# Patient Record
Sex: Female | Born: 1995 | Race: White | Hispanic: No | Marital: Single | Smoking: Never smoker
Health system: Southern US, Community
[De-identification: ages and names within clinical notes are randomized; demographics above are authoritative.]

---

## 2017-06-15 ENCOUNTER — Encounter: Payer: Self-pay | Admitting: Emergency Medicine

## 2017-06-15 ENCOUNTER — Emergency Department
Admission: EM | Admit: 2017-06-15 | Discharge: 2017-06-15 | Disposition: A | Discharge status: Home / Self Care | Payer: BLUE CROSS/BLUE SHIELD | Attending: Emergency Medicine | Admitting: Emergency Medicine

## 2017-06-15 ENCOUNTER — Other Ambulatory Visit: Payer: Self-pay

## 2017-06-15 ENCOUNTER — Emergency Department: Payer: BLUE CROSS/BLUE SHIELD

## 2017-06-15 DIAGNOSIS — R109 Unspecified abdominal pain: Secondary | ICD-10-CM | POA: Insufficient documentation

## 2017-06-15 DIAGNOSIS — R112 Nausea with vomiting, unspecified: Secondary | ICD-10-CM | POA: Insufficient documentation

## 2017-06-15 DIAGNOSIS — K529 Noninfective gastroenteritis and colitis, unspecified: Secondary | ICD-10-CM

## 2017-06-15 DIAGNOSIS — R1084 Generalized abdominal pain: Secondary | ICD-10-CM

## 2017-06-15 DIAGNOSIS — K5289 Other specified noninfective gastroenteritis and colitis: Secondary | ICD-10-CM | POA: Diagnosis not present

## 2017-06-15 LAB — URINALYSIS, ROUTINE W REFLEX MICROSCOPIC
BILIRUBIN URINE: NEGATIVE
Bacteria, UA: NONE SEEN
Glucose, UA: NEGATIVE mg/dL
KETONES UR: 20 mg/dL — AB
Nitrite: NEGATIVE
Protein, ur: NEGATIVE mg/dL
Specific Gravity, Urine: 1.02 (ref 1.005–1.030)
pH: 5 (ref 5.0–8.0)

## 2017-06-15 LAB — CBC WITH DIFFERENTIAL/PLATELET
BASOS ABS: 0 10*3/uL (ref 0–0.1)
BASOS PCT: 0 %
Eosinophils Absolute: 0 10*3/uL (ref 0–0.7)
Eosinophils Relative: 0 %
HEMATOCRIT: 48.2 % — AB (ref 35.0–47.0)
Hemoglobin: 16.8 g/dL — ABNORMAL HIGH (ref 12.0–16.0)
LYMPHS PCT: 3 %
Lymphs Abs: 0.5 10*3/uL — ABNORMAL LOW (ref 1.0–3.6)
MCH: 32.1 pg (ref 26.0–34.0)
MCHC: 34.9 g/dL (ref 32.0–36.0)
MCV: 92.1 fL (ref 80.0–100.0)
MONO ABS: 0.7 10*3/uL (ref 0.2–0.9)
Monocytes Relative: 4 %
NEUTROS ABS: 14.4 10*3/uL — AB (ref 1.4–6.5)
Neutrophils Relative %: 93 %
PLATELETS: 259 10*3/uL (ref 150–440)
RBC: 5.23 MIL/uL — ABNORMAL HIGH (ref 3.80–5.20)
RDW: 12.4 % (ref 11.5–14.5)
WBC: 15.6 10*3/uL — ABNORMAL HIGH (ref 3.6–11.0)

## 2017-06-15 LAB — COMPREHENSIVE METABOLIC PANEL
ALBUMIN: 4.9 g/dL (ref 3.5–5.0)
ALT: 23 U/L (ref 14–54)
AST: 31 U/L (ref 15–41)
Alkaline Phosphatase: 50 U/L (ref 38–126)
Anion gap: 12 (ref 5–15)
BILIRUBIN TOTAL: 1.5 mg/dL — AB (ref 0.3–1.2)
BUN: 13 mg/dL (ref 6–20)
CHLORIDE: 103 mmol/L (ref 101–111)
CO2: 23 mmol/L (ref 22–32)
CREATININE: 0.6 mg/dL (ref 0.44–1.00)
Calcium: 9.3 mg/dL (ref 8.9–10.3)
GFR calc Af Amer: >60 mL/min (ref >60)
GLUCOSE: 122 mg/dL — AB (ref 65–99)
POTASSIUM: 3.7 mmol/L (ref 3.5–5.1)
Sodium: 138 mmol/L (ref 135–145)
Total Protein: 8 g/dL (ref 6.5–8.1)

## 2017-06-15 LAB — POC URINE PREG, ED: Preg Test, Ur: NEGATIVE

## 2017-06-15 LAB — LIPASE, BLOOD: Lipase: 34 U/L (ref 11–51)

## 2017-06-15 MED ORDER — SODIUM CHLORIDE 0.9 % IV BOLUS
1000.0000 mL | Freq: Once | INTRAVENOUS | Status: AC
  Administered 2017-06-15: 1000 mL via INTRAVENOUS

## 2017-06-15 MED ORDER — IOHEXOL 300 MG/ML  SOLN: 60.0000 mL | Freq: Once | INTRAMUSCULAR | Status: DC | PRN

## 2017-06-15 MED ORDER — ONDANSETRON HCL 4 MG/2ML IJ SOLN
4.0000 mg | Freq: Once | INTRAMUSCULAR | Status: AC
  Administered 2017-06-15: 4 mg via INTRAVENOUS
  Filled 2017-06-15: qty 2

## 2017-06-15 MED ORDER — IOPAMIDOL (ISOVUE-300) INJECTION 61%
15.0000 mL | INTRAVENOUS | Status: AC
  Administered 2017-06-15: 15 mL via ORAL

## 2017-06-15 MED ORDER — IOPAMIDOL (ISOVUE-300) INJECTION 61%
60.0000 mL | Freq: Once | INTRAVENOUS | Status: AC | PRN
  Administered 2017-06-15: 60 mL via INTRAVENOUS

## 2017-06-15 MED ORDER — MORPHINE SULFATE (PF) 4 MG/ML IV SOLN
4.0000 mg | Freq: Once | INTRAVENOUS | Status: AC
  Administered 2017-06-15: 4 mg via INTRAVENOUS
  Filled 2017-06-15: qty 1

## 2017-06-15 MED ORDER — LOPERAMIDE HCL 2 MG PO TABS: 2.0000 mg | ORAL_TABLET | Freq: Four times a day (QID) | ORAL | 0 refills | Status: AC | PRN

## 2017-06-15 MED ORDER — ONDANSETRON 4 MG PO TBDP: 4.0000 mg | ORAL_TABLET | Freq: Three times a day (TID) | ORAL | 0 refills | Status: AC | PRN

## 2017-06-15 NOTE — Discharge Instructions (Addendum)
Please follow-up with your doctor in the next 2 to 3 days for recheck if he continued to have symptoms.  Please take Zofran and loperamide as needed for nausea and diarrhea, as written.  Please drink plenty of fluids and obtain plenty of rest.  Return to the emergency department for any significant fever 101 or higher development of focal abdominal pain or worsening abdominal pain.

## 2017-06-15 NOTE — ED Triage Notes (Signed)
Pt states abdominal pain began around 0730 this am with vomiting every 30 min or so.  Appears pale. Crying out in pain in triage, states pain is in her mid abdomen.

## 2017-06-15 NOTE — ED Provider Notes (Signed)
[facility] Emergency Department Provider Note  Time seen: 1:15 PM  I have reviewed the triage vital signs and the nursing notes.   HISTORY  Chief Complaint Abdominal Pain and Emesis    HPI Rhonda Mays is a 22 y.o. female with no past medical history who presents to the emergency department for nausea vomiting and abdominal pain.  According to the patient since this morning she has been nauseated with frequent episodes of vomiting and abdominal pain around her umbilicus and occasionally in the right lower quadrant.  Denies any fever, dysuria or hematuria, vaginal bleeding or discharge.  Last menstrual period was 2 weeks ago.  Denies any diarrhea.  Describes her pain is moderate, says it is hurting fairly diffusely but worse around her bellybutton.  Dull aching/cramping type pain.   History reviewed. No pertinent past medical history.  There are no active problems to display for this patient.   History reviewed. No pertinent surgical history.  Prior to Admission medications   Not on File    No Known Allergies  No family history on file.  Social History Social History   Tobacco Use  . Smoking status: Never Smoker  . Smokeless tobacco: Never Used  Substance Use Topics  . Alcohol use: Not Currently  . Drug use: Not on file    Review of Systems Constitutional: Negative for fever. Eyes: Negative for visual complaints ENT: Negative for recent illness/congestion Cardiovascular: Negative for chest pain. Respiratory: Negative for shortness of breath. Gastrointestinal: Positive for moderate to significant abdominal pain periumbilical and right lower quadrant.  Negative for diarrhea but positive for nausea vomiting. Genitourinary: Negative for dysuria, dark urine or foul smell to the urine.  Negative for vaginal bleeding or discharge. Musculoskeletal: Negative for musculoskeletal complaints Skin: Negative for skin complaints  Neurological: Negative  for headache All other ROS negative  ____________________________________________   PHYSICAL EXAM:  VITAL SIGNS: ED Triage Vitals  Enc Vitals Group     BP 06/15/17 1259 110/72     Pulse Rate 06/15/17 1259 (!) 118     Resp 06/15/17 1259 16     Temp 06/15/17 1259 97.9 F (36.6 C)     Temp src --      SpO2 06/15/17 1259 100 %     Weight 06/15/17 1300 95 lb (43.1 kg)     Height 06/15/17 1300 5' (1.524 m)     Head Circumference --      Peak Flow --      Pain Score 06/15/17 1300 8     Pain Loc --      Pain Edu? --      Excl. in GC? --    Constitutional: Alert and oriented.  Mild distress due to abdominal pain holding abdomen and moaning at times. Eyes: Normal exam ENT   Head: Normocephalic and atraumatic.   Mouth/Throat: Mucous membranes are moist. Cardiovascular: Normal rate, regular rhythm. No murmur Respiratory: Normal respiratory effort without tachypnea nor retractions. Breath sounds are clear  Gastrointestinal: Soft, moderate periumbilical tenderness to palpation, mild diffuse tenderness, no rebound or guarding.  No distention. Musculoskeletal: Nontender with normal range of motion in all extremities.  Neurologic:  Normal speech and language. No gross focal neurologic deficits  Skin:  Skin is warm, dry and intact.  Psychiatric: Mood and affect are normal.    RADIOLOGY  CT most consistent with enteritis.  ____________________________________________   INITIAL IMPRESSION / ASSESSMENT AND PLAN / ED COURSE  Pertinent labs & imaging results that  were available during my care of the patient were reviewed by me and considered in my medical decision making (see chart for details).  Patient presents to the emergency department for periumbilical abdominal pain nausea vomiting since this morning.  Differential would include gastroenteritis, enteritis, gastritis, gastric or peptic ulcers, urinary tract infection, pelvic infection, appendicitis.  We will check labs,  urinalysis, proceed with CT scan of the abdomen/pelvis to further evaluate.  Patient agreeable to this plan of care.  Patient's labs show a moderate leukocytosis of 15,000, otherwise largely within normal limits.  We will dose IV fluids for mild amount of ketones within the urinalysis.  Patient CT scan has resulted consistent with enteritis.  We will place the patient on Zofran, loperamide to be used as needed.  Patient agreeable to this plan of care. ____________________________________________   FINAL CLINICAL IMPRESSION(S) / ED DIAGNOSES  Abdominal pain Enteritis   Minna Antis, MD 06/15/17 1541

## 2019-08-30 IMAGING — CT CT ABD-PELV W/ CM
2 of 4 series · 16 of 46 positions shown, 18 images · IV contrast (isovue)
Comparison: None.

CLINICAL DATA: 21-year-old female with acute abdominal and pelvic
pain and vomiting today.

EXAM:
CT ABDOMEN AND PELVIS WITH CONTRAST
TECHNIQUE: Multidetector CT imaging of the abdomen and pelvis was performed
using the standard protocol following bolus administration of
intravenous contrast.
CONTRAST:  60 cc intravenous Isovue 300

[Series 2: routine abd/pel with · axial · 0.63mm/px · z∈[-490,-90]mm · 13 of 88 slices shown, 15 images]
[im 4/88  soft-tissue]
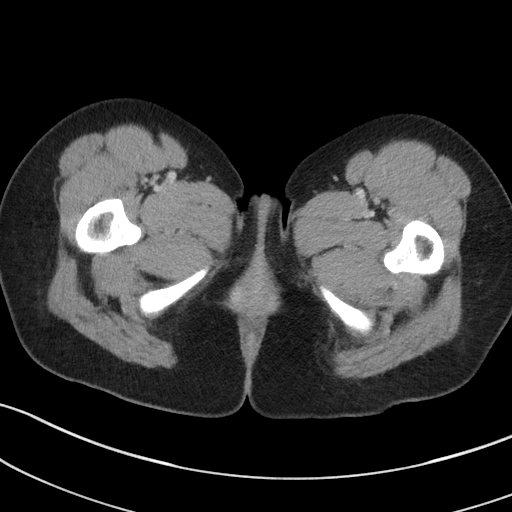
[im 4/88  bone]
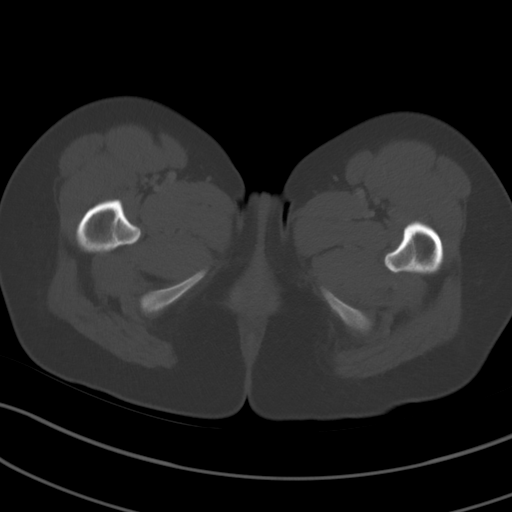
[im 11/88  soft-tissue]
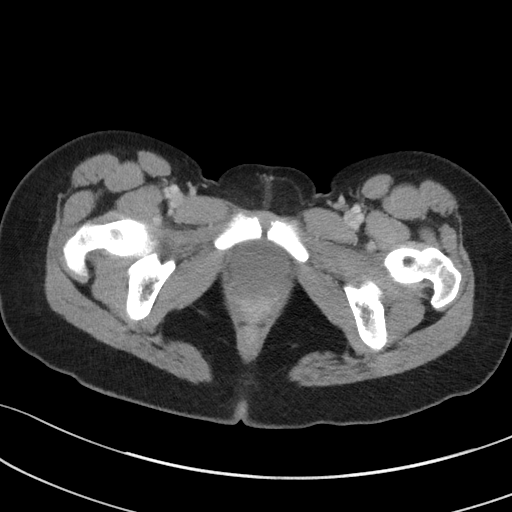
[im 19/88  soft-tissue]
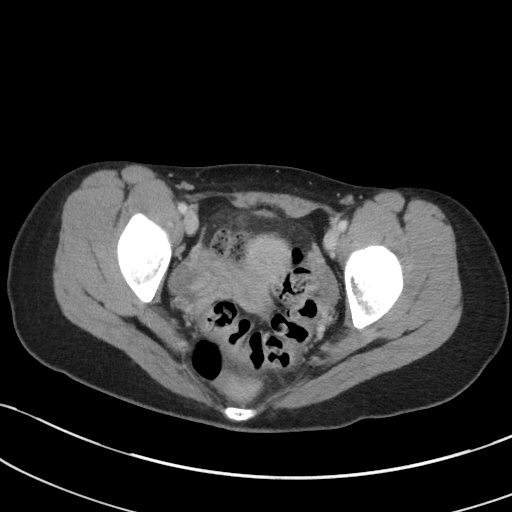
[im 26/88  soft-tissue]
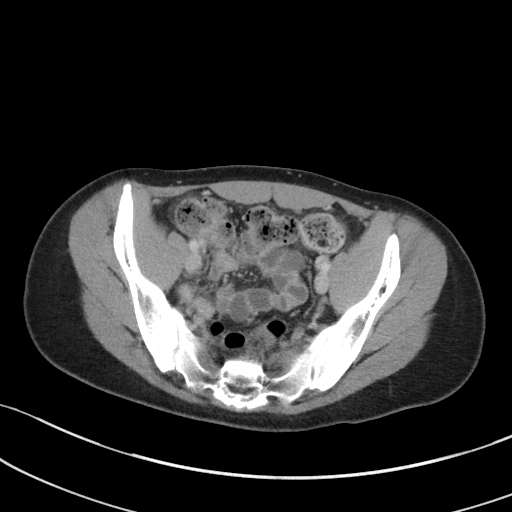
[im 30/88  soft-tissue]
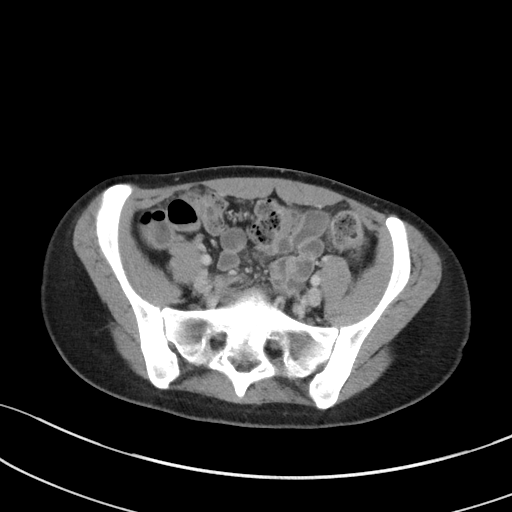
[im 37/88  soft-tissue]
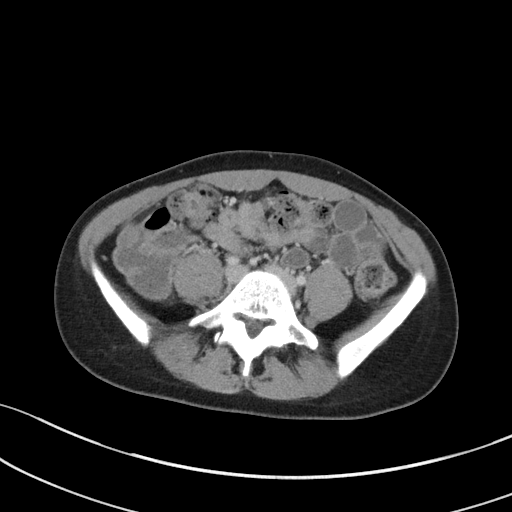
[im 44/88  soft-tissue]
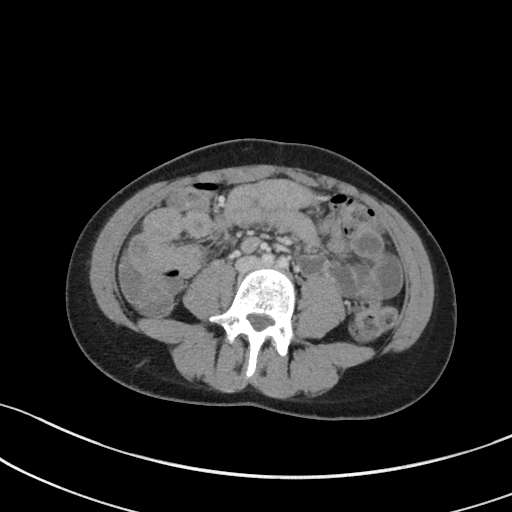
[im 51/88  soft-tissue]
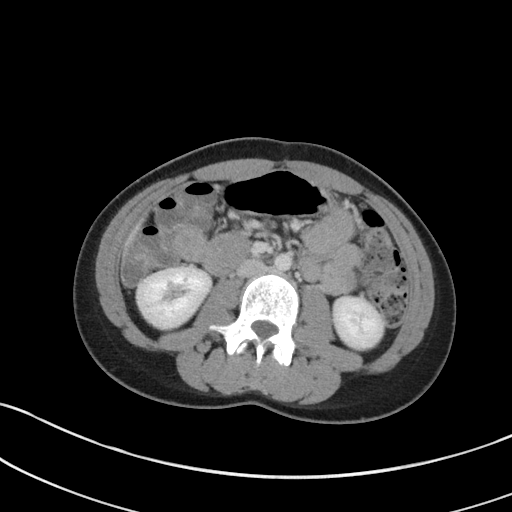
[im 59/88  soft-tissue]
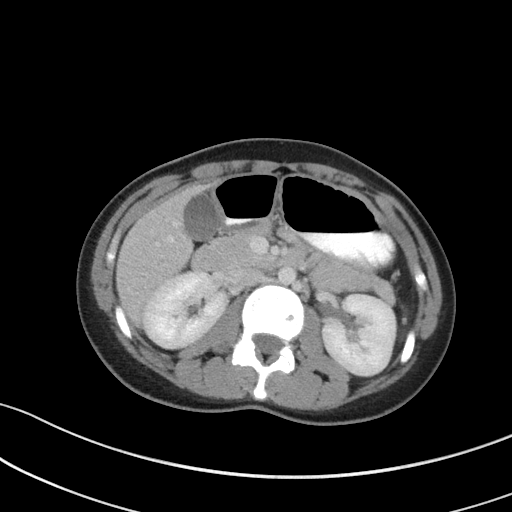
[im 59/88  bone]
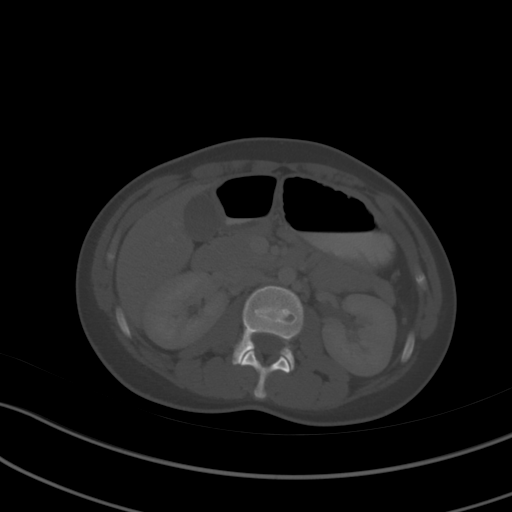
[im 62/88  soft-tissue]
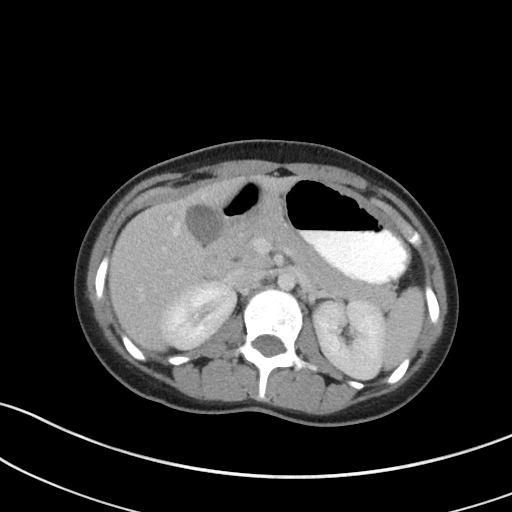
[im 69/88  soft-tissue]
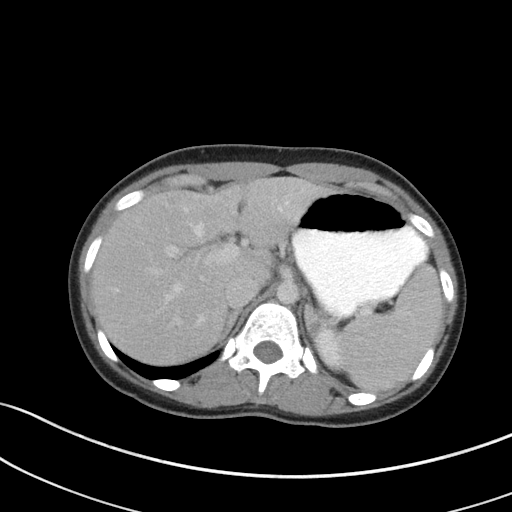
[im 77/88  soft-tissue]
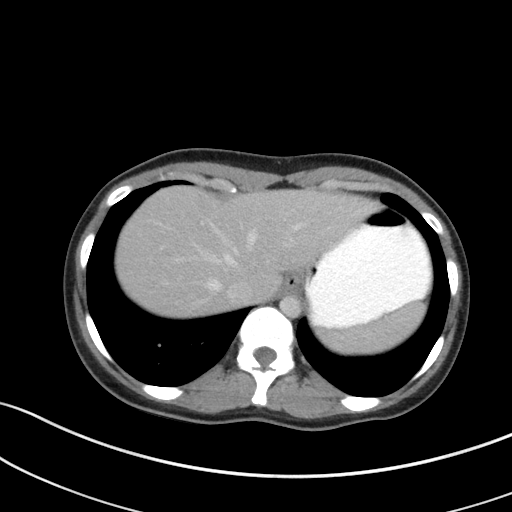
[im 84/88  soft-tissue]
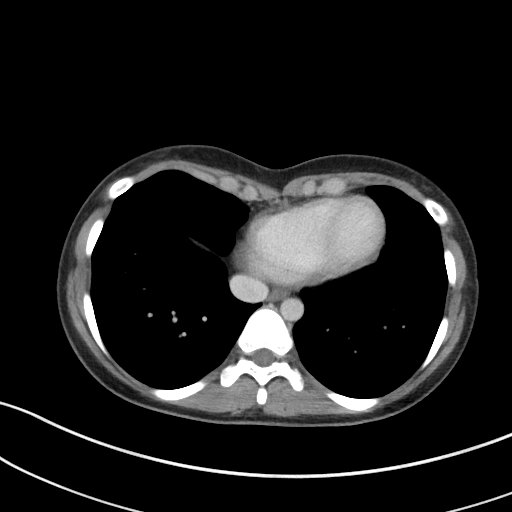

[Series 5: coronal st · coronal · 0.57mm/px · 3 of 62 slices shown]
[im 21/62  soft-tissue]
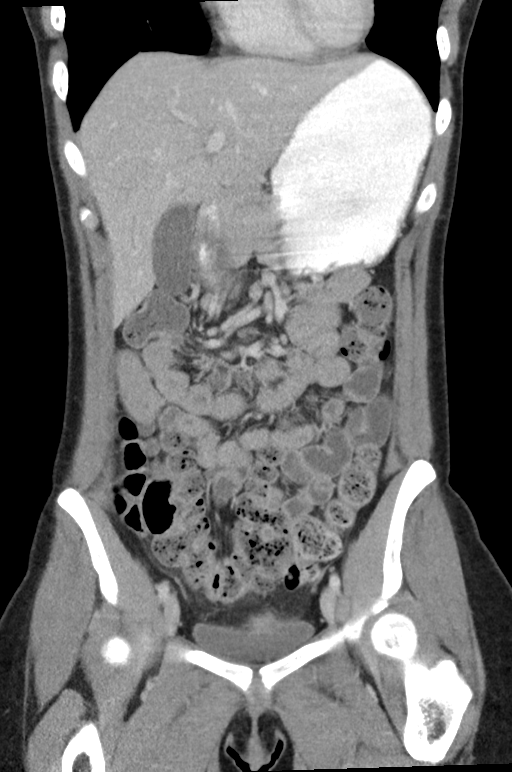
[im 28/62  soft-tissue]
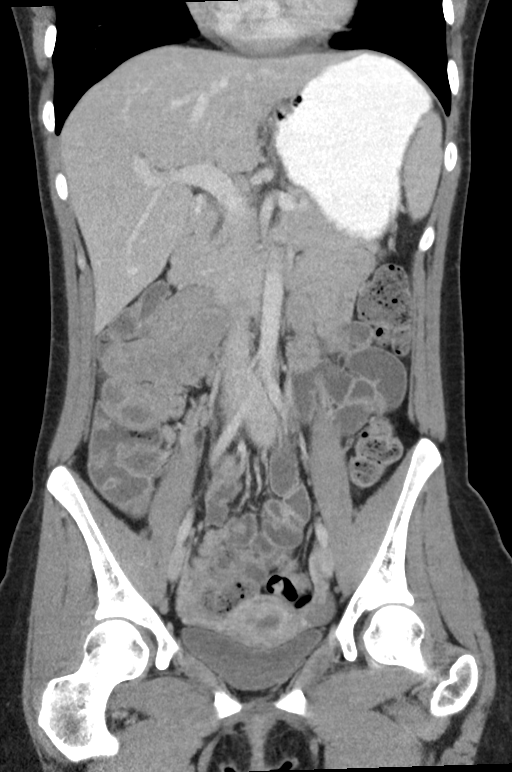
[im 34/62  soft-tissue]
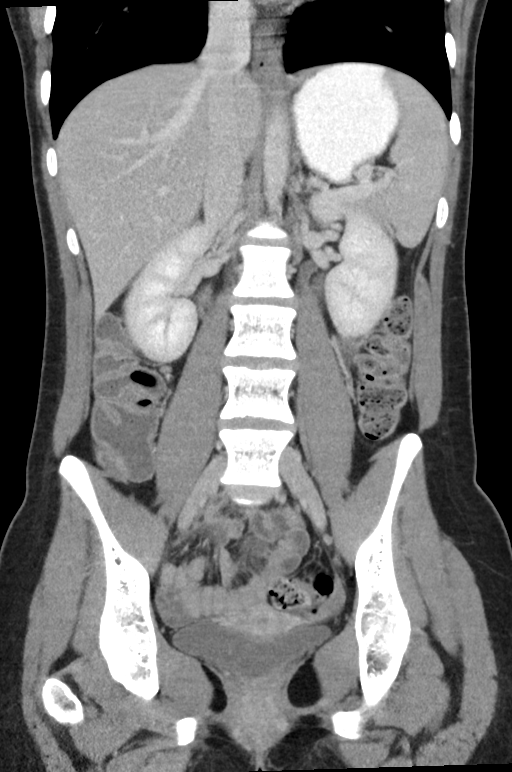

[16 of 46 positions shown; findings below may reference images not displayed]

FINDINGS: Lower chest: No acute abnormality.  Pectus excavatum identified.

Hepatobiliary: The liver and gallbladder are unremarkable. No
biliary dilatation.

Pancreas: Unremarkable

Spleen: Unremarkable

Adrenals/Urinary Tract: The kidneys, adrenal glands and bladder are
unremarkable.

Stomach/Bowel: Apparent mild circumferential wall thickening of
small bowel loops within the abdomen and pelvis noted and suggestive
of an enteritis. There is no evidence of bowel obstruction or other
bowel wall thickening. No inflammatory changes are identified. The
appendix is normal.

Vascular/Lymphatic: No significant vascular findings are present. No
enlarged abdominal or pelvic lymph nodes.

Reproductive: Uterus and bilateral adnexa are unremarkable.

Other: No ascites, pneumoperitoneum or abscess.

Musculoskeletal: No acute or significant osseous findings.
IMPRESSION: 1. Apparent mild circumferential wall thickening of small bowel
loops within the abdomen and pelvis suggestive of an enteritis. No
evidence of bowel obstruction or pneumoperitoneum. Normal appendix.
